# Patient Record
Sex: Male | Born: 1990 | Race: White | Hispanic: No | Marital: Single | Smoking: Current every day smoker
Health system: Southern US, Community
[De-identification: ages and names within clinical notes are randomized; demographics above are authoritative.]

---

## 2014-05-23 ENCOUNTER — Emergency Department (HOSPITAL_COMMUNITY)
Admission: EM | Admit: 2014-05-23 | Discharge: 2014-05-23 | Disposition: A | Discharge status: Home / Self Care | Payer: Self-pay | Attending: Emergency Medicine | Admitting: Emergency Medicine

## 2014-05-23 ENCOUNTER — Emergency Department (HOSPITAL_COMMUNITY): Payer: Self-pay

## 2014-05-23 ENCOUNTER — Encounter (HOSPITAL_COMMUNITY): Payer: Self-pay | Admitting: Emergency Medicine

## 2014-05-23 DIAGNOSIS — S4980XA Other specified injuries of shoulder and upper arm, unspecified arm, initial encounter: Secondary | ICD-10-CM | POA: Insufficient documentation

## 2014-05-23 DIAGNOSIS — X500XXA Overexertion from strenuous movement or load, initial encounter: Secondary | ICD-10-CM | POA: Insufficient documentation

## 2014-05-23 DIAGNOSIS — S79919A Unspecified injury of unspecified hip, initial encounter: Secondary | ICD-10-CM | POA: Insufficient documentation

## 2014-05-23 DIAGNOSIS — Y990 Civilian activity done for income or pay: Secondary | ICD-10-CM | POA: Insufficient documentation

## 2014-05-23 DIAGNOSIS — F17200 Nicotine dependence, unspecified, uncomplicated: Secondary | ICD-10-CM | POA: Insufficient documentation

## 2014-05-23 DIAGNOSIS — M25512 Pain in left shoulder: Secondary | ICD-10-CM

## 2014-05-23 DIAGNOSIS — S46909A Unspecified injury of unspecified muscle, fascia and tendon at shoulder and upper arm level, unspecified arm, initial encounter: Secondary | ICD-10-CM | POA: Insufficient documentation

## 2014-05-23 DIAGNOSIS — S79929A Unspecified injury of unspecified thigh, initial encounter: Secondary | ICD-10-CM

## 2014-05-23 DIAGNOSIS — M25552 Pain in left hip: Secondary | ICD-10-CM

## 2014-05-23 DIAGNOSIS — Y9289 Other specified places as the place of occurrence of the external cause: Secondary | ICD-10-CM | POA: Insufficient documentation

## 2014-05-23 MED ORDER — HYDROCODONE-ACETAMINOPHEN 5-325 MG PO TABS: 1.0000 | ORAL_TABLET | Freq: Four times a day (QID) | ORAL | Status: DC | PRN

## 2014-05-23 MED ORDER — ONDANSETRON HCL 4 MG PO TABS: 4.0000 mg | ORAL_TABLET | Freq: Four times a day (QID) | ORAL | Status: DC

## 2014-05-23 NOTE — ED Provider Notes (Signed)
CSN: [id number]     Arrival date & time 05/23/14  1923 History   First MD Initiated Contact with Patient 05/23/14 2029    This chart was scribed for non-physician practitioner, Junious Silk PA-C working with Mirian Mo, MD, by Andrew Au, ED Scribe. This patient was seen in room WTR7/WTR7 and the patient's care was started at 8:45 PM. Chief Complaint  Patient presents with  . Shoulder Injury  . Hip Pain    The history is provided by the patient. No language interpreter was used.    Glenn Potts is a 23 y.o. male who presents to the Emergency Department complaining of left shoulder pain and left hip pain. 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 The pain is worse in his shoulder than his hip. He has ambulated without difficulty since the time of the incident. The shoulder pain is sharp. Pain is worse with movement and palpation. Pt has not taken any medication to improve his symptoms. Pt denies hitting head and LOC. He denies nausea, emesis , CP and SOB.    History reviewed. No pertinent past medical history. History reviewed. No pertinent past surgical history. No family history on file. History  Substance Use Topics  . Smoking status: Current Every Day Smoker -- 0.25 packs/day    Types: Cigarettes  . Smokeless tobacco: Not on file  . Alcohol Use: No    Review of Systems  Respiratory: Negative for shortness of breath.   Cardiovascular: Negative for chest pain.  Gastrointestinal: Negative for nausea and vomiting.  Musculoskeletal: Positive for arthralgias and myalgias.  Neurological: Negative for syncope, weakness and numbness.   Allergies  Review of patient's allergies indicates no known allergies.  Home Medications   Prior to Admission medications   Not on File   BP 145/70  Pulse 95  Temp(Src) 99.1 F (37.3 C) (Oral)  Resp 18  SpO2 99% Physical Exam  Nursing note and vitals reviewed. Constitutional: He is oriented to person,  place, and time. He appears well-developed and well-nourished. No distress.  HENT:  Head: Normocephalic and atraumatic.  Right Ear: External ear normal.  Left Ear: External ear normal.  Nose: Nose normal.  Eyes: Conjunctivae are normal.  Neck: Normal range of motion. No tracheal deviation present.  Cardiovascular: Normal rate, regular rhythm and normal heart sounds.   Pulses:      Radial pulses are 2+ on the right side, and 2+ on the left side.       Posterior tibial pulses are 2+ on the right side, and 2+ on the left side.  Pulmonary/Chest: Effort normal and breath sounds normal. No stridor.  Abdominal: Soft. He exhibits no distension. There is no tenderness.  Musculoskeletal: Normal range of motion.       Left shoulder: He exhibits no swelling and no deformity.       Left hip: He exhibits no tenderness, no swelling, no deformity and no laceration.  TTP diffusely ove left shoulder. ROM limited due to pain. TTP over left lateral hip. Normal gait.   Neurological: He is alert and oriented to person, place, and time.  Skin: Skin is warm and dry. No lesion and no rash noted. He is not diaphoretic.  Psychiatric: He has a normal mood and affect. His behavior is normal.    ED Course  Procedures (including critical care time) DIAGNOSTIC STUDIES: Oxygen Saturation is 96% on RA, normal by my interpretation.    COORDINATION OF CARE: 8:52 PM-  Pt advised of plan for treatment and pt agrees.  Labs Review Labs Reviewed - No data to display  Imaging Review Dg Hip Complete Left  05/23/2014   CLINICAL DATA:  Fall, left hip pain  EXAM: LEFT HIP - COMPLETE 2+ VIEW  COMPARISON:  None.  FINDINGS: No fracture or dislocation is seen.  Bilateral hip joint spaces are preserved.  Visualized bony pelvis appears intact.  IMPRESSION: No fracture or dislocation is seen.   Electronically Signed   By: Charline Bills M.D.   On: 05/23/2014 20:33   Dg Shoulder Left  05/23/2014   CLINICAL DATA:  Pain post  trauma  EXAM: LEFT SHOULDER - 2+ VIEW  COMPARISON:  None.  FINDINGS: Frontal, Y scapular, and axillary images were obtained. No fracture or dislocation. Joint spaces appear intact. No erosive change.  IMPRESSION: No fracture or appreciable arthropathy.   Electronically Signed   By: Bretta Bang M.D.   On: 05/23/2014 20:30     EKG Interpretation None      MDM   Final diagnoses:  Shoulder pain, left  Hip pain, left   Patient presents patient presents emergency department for evaluation of left hip and shoulder pain after tackling a shop lifter earlier in the evening. No loss of consciousness or pain elsewhere. X-rays of shoulder and hip show no acute abnormality. Sensation is intact and compartments are soft. Patient was given a sling for comfort for his left shoulder. Discussed rest, ice, elevation. Discussed reasons to return to emergency Department immediately. Vital signs stable for discharge. Patient / Family / Caregiver informed of clinical course, understand medical decision-making process, and agree with plan.   I personally performed the services described in this documentation, which was scribed in my presence. The recorded information has been reviewed and is accurate.    Mora Bellman, PA-C 05/25/14 [phone]

## 2014-05-23 NOTE — Discharge Instructions (Signed)
Hip Pain Your hip is the joint between your upper legs and your lower pelvis. The bones, cartilage, tendons, and muscles of your hip joint perform a lot of work each day supporting your body weight and allowing you to move around. Hip pain can range from a minor ache to severe pain in one or both of your hips. Pain may be felt on the inside of the hip joint near the groin, or the outside near the buttocks and upper thigh. You may have swelling or stiffness as well.  HOME CARE INSTRUCTIONS   Take medicines only as directed by your health care provider.  Apply ice to the injured area:  Put ice in a plastic bag.  Place a towel between your skin and the bag.  Leave the ice on for 15-20 minutes at a time, 3-4 times a day.  Keep your leg raised (elevated) when possible to lessen swelling.  Avoid activities that cause pain.  Follow specific exercises as directed by your health care provider.  Sleep with a pillow between your legs on your most comfortable side.  Record how often you have hip pain, the location of the pain, and what it feels like. SEEK MEDICAL CARE IF:   You are unable to put weight on your leg.  Your hip is red or swollen or very tender to touch.  Your pain or swelling continues or worsens after 1 week.  You have increasing difficulty walking.  You have a fever. SEEK IMMEDIATE MEDICAL CARE IF:   You have fallen.  You have a sudden increase in pain and swelling in your hip. MAKE SURE YOU:   Understand these instructions.  Will watch your condition.  Will get help right away if you are not doing well or get worse. Document Released: 01/30/2010 Document Revised: 12/27/2013 Document Reviewed: 04/08/2013 [facility] Patient Information 2015 [place], [place]. This information is not intended to replace advice given to you by your health care provider. Make sure you discuss any questions you have with your health care provider.  Shoulder Pain The shoulder is the joint  that connects your arms to your body. The bones that form the shoulder joint include the upper arm bone (humerus), the shoulder blade (scapula), and the collarbone (clavicle). The top of the humerus is shaped like a ball and fits into a rather flat socket on the scapula (glenoid cavity). A combination of muscles and strong, fibrous tissues that connect muscles to bones (tendons) support your shoulder joint and hold the ball in the socket. Small, fluid-filled sacs (bursae) are located in different areas of the joint. They act as cushions between the bones and the overlying soft tissues and help reduce friction between the gliding tendons and the bone as you move your arm. Your shoulder joint allows a wide range of motion in your arm. This range of motion allows you to do things like scratch your back or throw a ball. However, this range of motion also makes your shoulder more prone to pain from overuse and injury. Causes of shoulder pain can originate from both injury and overuse and usually can be grouped in the following four categories:  Redness, swelling, and pain (inflammation) of the tendon (tendinitis) or the bursae (bursitis).  Instability, such as a dislocation of the joint.  Inflammation of the joint (arthritis).  Broken bone (fracture). HOME CARE INSTRUCTIONS   Apply ice to the sore area.  Put ice in a plastic bag.  Place a towel between your skin and the  bag.  Leave the ice on for 15-20 minutes, 3-4 times per day for the first 2 days, or as directed by your health care provider.  Stop using cold packs if they do not help with the pain.  If you have a shoulder sling or immobilizer, wear it as long as your caregiver instructs. Only remove it to shower or bathe. Move your arm as little as possible, but keep your hand moving to prevent swelling.  Squeeze a soft ball or foam pad as much as possible to help prevent swelling.  Only take over-the-counter or prescription medicines for  pain, discomfort, or fever as directed by your caregiver. SEEK MEDICAL CARE IF:   Your shoulder pain increases, or new pain develops in your arm, hand, or fingers.  Your hand or fingers become cold and numb.  Your pain is not relieved with medicines. SEEK IMMEDIATE MEDICAL CARE IF:   Your arm, hand, or fingers are numb or tingling.  Your arm, hand, or fingers are significantly swollen or turn white or blue. MAKE SURE YOU:   Understand these instructions.  Will watch your condition.  Will get help right away if you are not doing well or get worse. Document Released: 05/22/2005 Document Revised: 12/27/2013 Document Reviewed: 07/27/2011 [facility] Patient Information 2015 [place], [place]. This information is not intended to replace advice given to you by your health care provider. Make sure you discuss any questions you have with your health care provider.  Shoulder Range of Motion Exercises The shoulder is the most flexible joint in the human body. Because of this it is also the most unstable joint in the body. All ages can develop shoulder problems. Early treatment of problems is necessary for a good outcome. People react to shoulder pain by decreasing the movement of the joint. After a brief period of time, the shoulder can become "frozen". This is an almost complete loss of the ability to move the damaged shoulder. Following injuries your caregivers can give you instructions on exercises to keep your range of motion (ability to move your shoulder freely), or regain it if it has been lost.  EXERCISES EXERCISES TO MAINTAIN THE MOBILITY OF YOUR SHOULDER: Codman's Exercise or Pendulum Exercise  This exercise may be performed in a prone (face-down) lying position or standing while leaning on a chair with the opposite arm. Its purpose is to relax the muscles in your shoulder and slowly but surely increase the range of motion and to relieve pain.  Lie on your stomach close to the side edge of  the bed. Let your weak arm hang over the edge of the bed. Relax your shoulder, arm and hand. Let your shoulder blade relax and drop down.  Slowly and gently swing your arm forward and back. Do not use your neck muscles; relax them. It might be easier to have someone else gently start swinging your arm.  As pain decreases, increase your swing. To start, arm swing should begin at 15 degree angles. In time and as pain lessens, move to 30-45 degree angles. Start with swinging for about 15 seconds, and work towards swinging for 3 to 5 minutes.  This exercise may also be performed in a standing/bent over position.  Stand and hold onto a sturdy chair with your good arm. Bend forward at the waist and bend your knees slightly to help protect your back. Relax your weak arm, let it hang limp. Relax your shoulder blade and let it drop.  Keep your shoulder relaxed and  use body motion to swing your arm in small circles.  Stand up tall and relax.  Repeat motion and change direction of circles.  Start with swinging for about 30 seconds, and work towards swinging for 3 to 5 minutes. STRETCHING EXERCISES:  Lift your arm out in front of you with the elbow bent at 90 degrees. Using your other arm gently pull the elbow forward and across your body.  Bend one arm behind you with the palm facing outward. Using the other arm, hold a towel or rope and reach this arm up above your head, then bend it at the elbow to move your wrist to behind your neck. Grab the free end of the towel with the hand behind your back. Gently pull the towel up with the hand behind your neck, gradually increasing the pull on the hand behind the small of your back. Then, gradually pull down with the hand behind the small of your back. This will pull the hand and arm behind your neck further. Both shoulders will have an increased range of motion with repetition of this exercise. STRENGTHENING EXERCISES:  Standing with your arm at your side and  straight out from your shoulder with the elbow bent at 90 degrees, hold onto a small weight and slowly raise your hand so it points straight up in the air. Repeat this five times to begin with, and gradually increase to ten times. Do this four times per day. As you grow stronger you can gradually increase the weight.  Repeat the above exercise, only this time using an elastic band. Start with your hand up in the air and pull down until your hand is by your side. As you grow stronger, gradually increase the amount you pull by increasing the number or size of the elastic bands. Use the same amount of repetitions.  Standing with your hand at your side and holding onto a weight, gradually lift the hand in front of you until it is over your head. Do the same also with the hand remaining at your side and lift the hand away from your body until it is again over your head. Repeat this five times to begin with, and gradually increase to ten times. Do this four times per day. As you grow stronger you can gradually increase the weight. Document Released: 05/11/2003 Document Revised: 08/17/2013 Document Reviewed: 08/12/2005 [facility] Patient Information 2015 [place], [place]. This information is not intended to replace advice given to you by your health care provider. Make sure you discuss any questions you have with your health care provider.

## 2014-05-23 NOTE — ED Notes (Signed)
Pt reports L shoulder and L hip pain, was tackling a shop lifter at work.  Larey Seat and landed on his L side.

## 2014-05-25 NOTE — ED Provider Notes (Signed)
Medical screening examination/treatment/procedure(s) were performed by non-physician practitioner and as supervising physician I was immediately available for consultation/collaboration.   EKG Interpretation None        Mirian MoMatthew Gentry, MD 05/25/14 1139

## 2016-01-05 IMAGING — CR DG HIP (WITH OR WITHOUT PELVIS) 2-3V*L*
3 series · 3 of 3 positions shown · non-contrast
Comparison: None.

CLINICAL DATA: Fall, left hip pain

EXAM:
LEFT HIP - COMPLETE 2+ VIEW

[t pelvis ap]
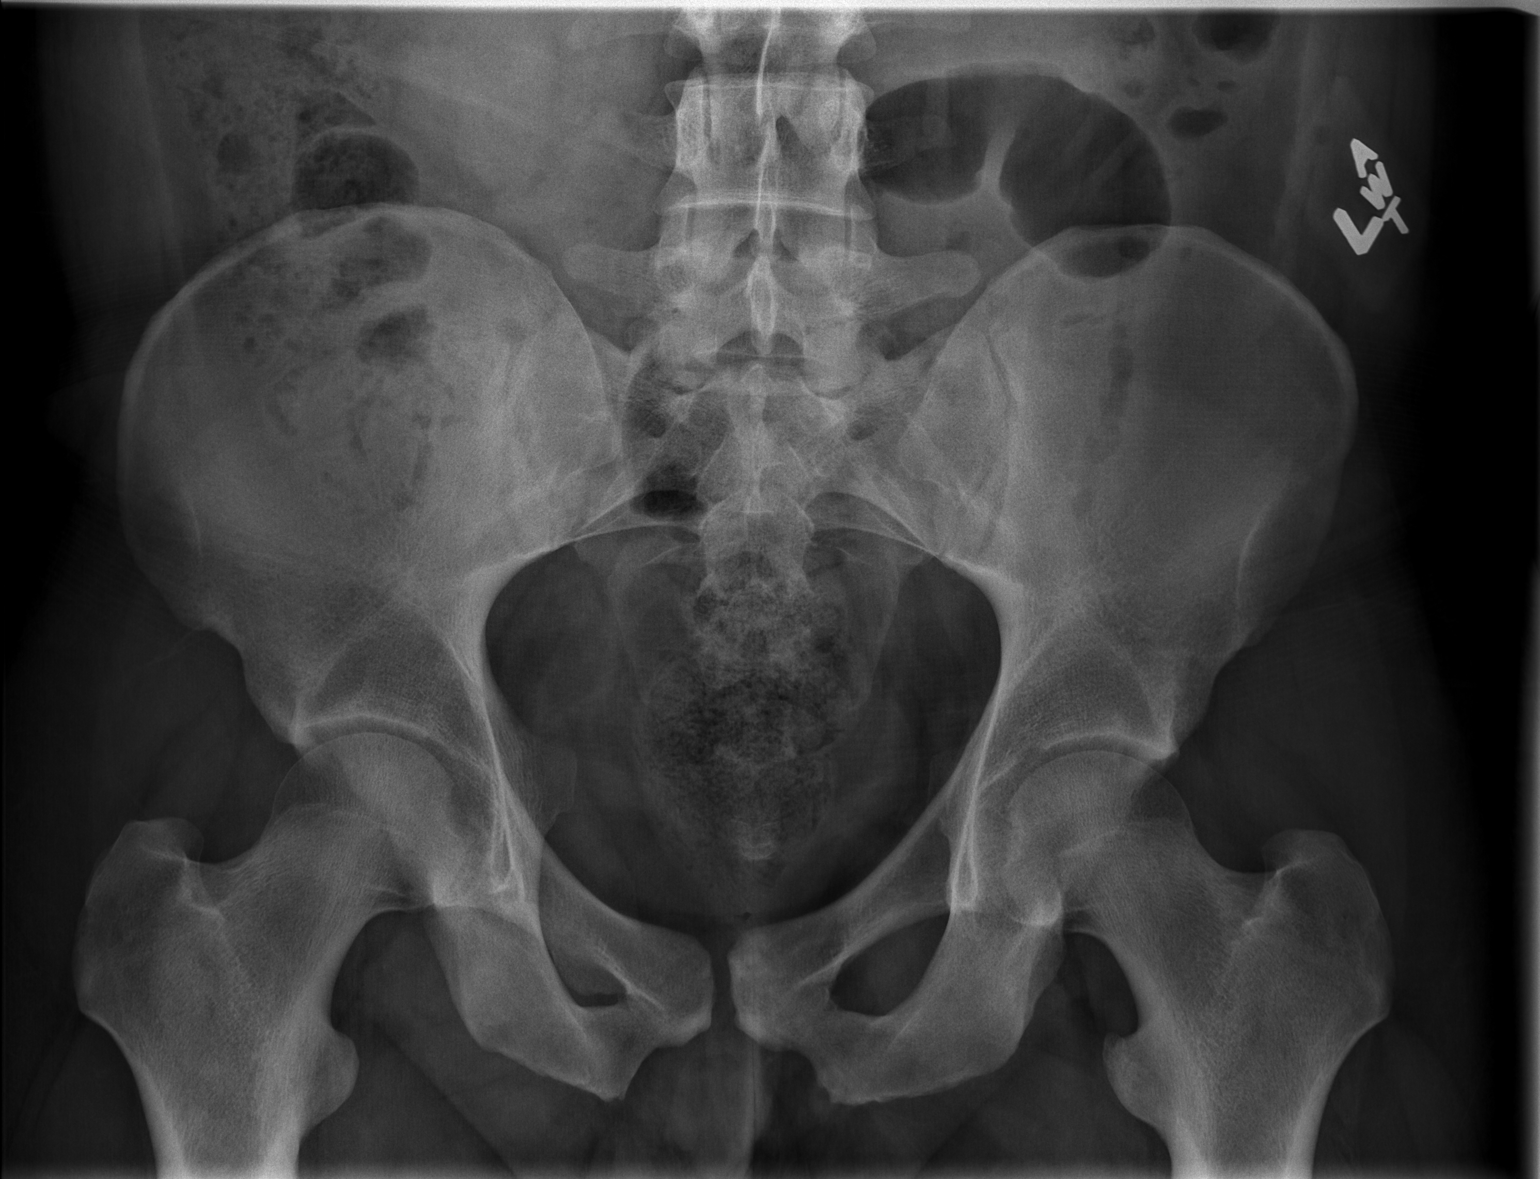

[t hip ap left]
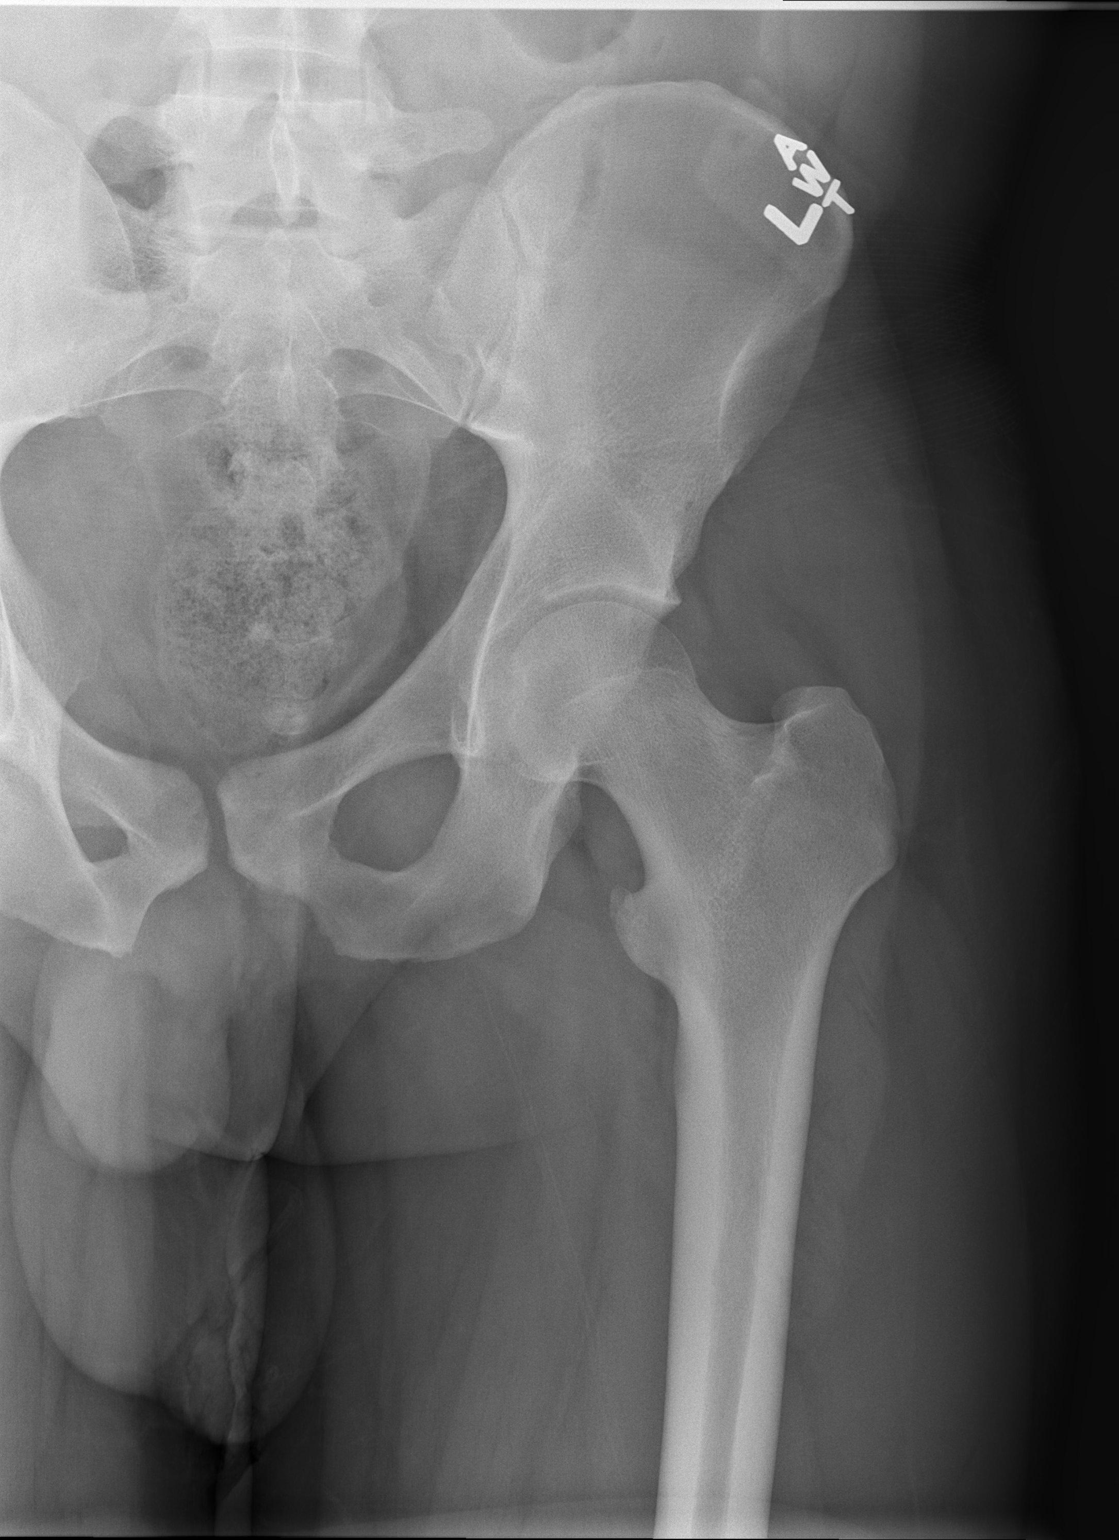

[t hip frog leg left]
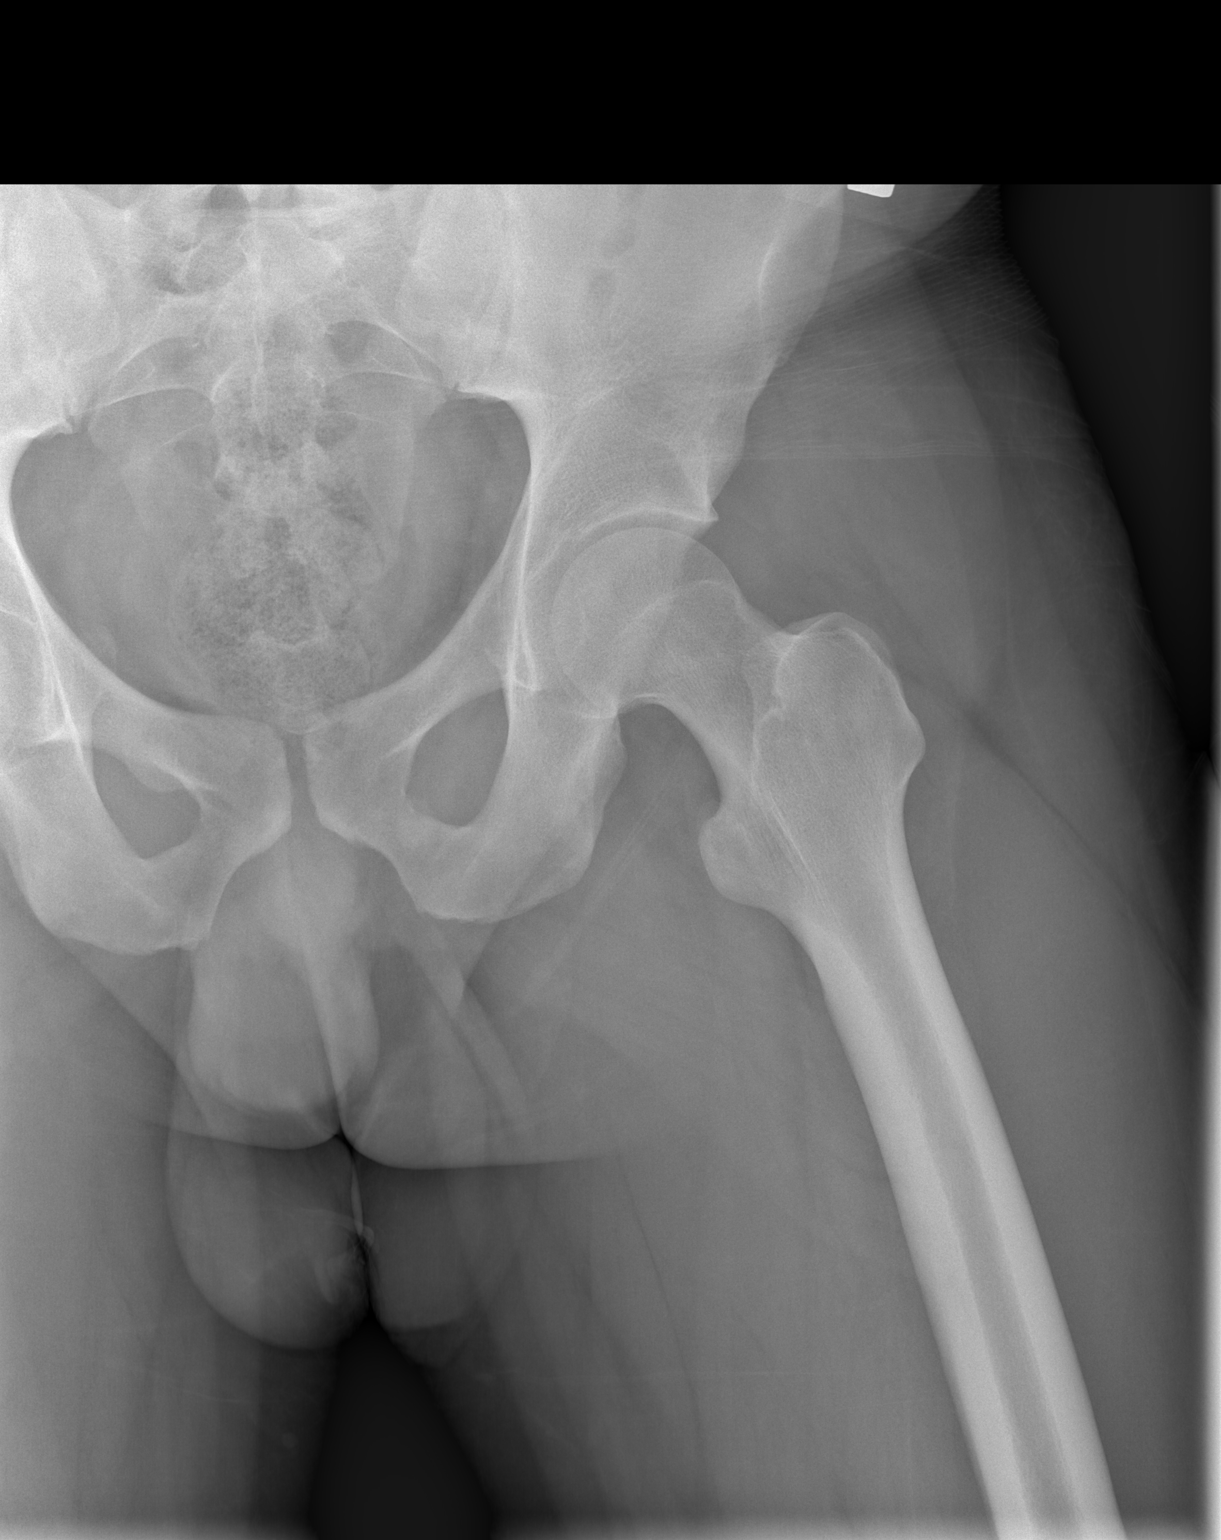

[3 of 3 positions shown; findings below may reference images not displayed]

FINDINGS: No fracture or dislocation is seen.

Bilateral hip joint spaces are preserved.

Visualized bony pelvis appears intact.
IMPRESSION: No fracture or dislocation is seen.
